# Patient Record
Sex: Male | Born: 1950 | Race: White | Hispanic: No | Smoking: Current every day smoker
Health system: Southern US, Community
[De-identification: ages and names within clinical notes are randomized; demographics above are authoritative.]

## PROBLEM LIST (undated history)

## (undated) DIAGNOSIS — B20 Human immunodeficiency virus [HIV] disease: Secondary | ICD-10-CM

## (undated) DIAGNOSIS — Z21 Asymptomatic human immunodeficiency virus [HIV] infection status: Secondary | ICD-10-CM

## (undated) DIAGNOSIS — I10 Essential (primary) hypertension: Secondary | ICD-10-CM

## (undated) HISTORY — PX: FEMORAL BYPASS: SHX50

---

## 2013-05-28 DIAGNOSIS — I739 Peripheral vascular disease, unspecified: Secondary | ICD-10-CM | POA: Insufficient documentation

## 2013-05-28 DIAGNOSIS — E785 Hyperlipidemia, unspecified: Secondary | ICD-10-CM | POA: Insufficient documentation

## 2013-05-28 DIAGNOSIS — I10 Essential (primary) hypertension: Secondary | ICD-10-CM | POA: Insufficient documentation

## 2013-05-28 DIAGNOSIS — Z21 Asymptomatic human immunodeficiency virus [HIV] infection status: Secondary | ICD-10-CM | POA: Insufficient documentation

## 2013-05-28 DIAGNOSIS — F17200 Nicotine dependence, unspecified, uncomplicated: Secondary | ICD-10-CM | POA: Insufficient documentation

## 2013-05-28 DIAGNOSIS — B20 Human immunodeficiency virus [HIV] disease: Secondary | ICD-10-CM | POA: Insufficient documentation

## 2014-01-07 ENCOUNTER — Encounter (HOSPITAL_BASED_OUTPATIENT_CLINIC_OR_DEPARTMENT_OTHER): Payer: Self-pay | Admitting: Emergency Medicine

## 2014-01-07 ENCOUNTER — Emergency Department (HOSPITAL_BASED_OUTPATIENT_CLINIC_OR_DEPARTMENT_OTHER): Payer: 59

## 2014-01-07 ENCOUNTER — Emergency Department (HOSPITAL_BASED_OUTPATIENT_CLINIC_OR_DEPARTMENT_OTHER)
Admission: EM | Admit: 2014-01-07 | Discharge: 2014-01-07 | Disposition: A | Discharge status: Home / Self Care | Payer: 59 | Attending: Emergency Medicine | Admitting: Emergency Medicine

## 2014-01-07 DIAGNOSIS — M5380 Other specified dorsopathies, site unspecified: Secondary | ICD-10-CM | POA: Insufficient documentation

## 2014-01-07 DIAGNOSIS — Z21 Asymptomatic human immunodeficiency virus [HIV] infection status: Secondary | ICD-10-CM | POA: Insufficient documentation

## 2014-01-07 DIAGNOSIS — Z79899 Other long term (current) drug therapy: Secondary | ICD-10-CM | POA: Insufficient documentation

## 2014-01-07 DIAGNOSIS — M62830 Muscle spasm of back: Secondary | ICD-10-CM

## 2014-01-07 DIAGNOSIS — I10 Essential (primary) hypertension: Secondary | ICD-10-CM | POA: Insufficient documentation

## 2014-01-07 HISTORY — DX: Asymptomatic human immunodeficiency virus (hiv) infection status: Z21

## 2014-01-07 HISTORY — DX: Human immunodeficiency virus (HIV) disease: B20

## 2014-01-07 HISTORY — DX: Essential (primary) hypertension: I10

## 2014-01-07 MED ORDER — KETOROLAC TROMETHAMINE 30 MG/ML IJ SOLN
30.0000 mg | Freq: Once | INTRAMUSCULAR | Status: AC
  Administered 2014-01-07: 30 mg via INTRAVENOUS
  Filled 2014-01-07: qty 1

## 2014-01-07 MED ORDER — METHOCARBAMOL 500 MG PO TABS
1000.0000 mg | ORAL_TABLET | Freq: Once | ORAL | Status: AC
  Administered 2014-01-07: 1000 mg via ORAL
  Filled 2014-01-07: qty 2

## 2014-01-07 MED ORDER — MELOXICAM 7.5 MG PO TABS
7.5000 mg | ORAL_TABLET | Freq: Every day | ORAL | Status: DC
Start: 2014-01-07 — End: 2015-02-09

## 2014-01-07 MED ORDER — METHOCARBAMOL 750 MG PO TABS: 750.0000 mg | ORAL_TABLET | Freq: Three times a day (TID) | ORAL | Status: DC

## 2014-01-07 MED ORDER — OXYCODONE-ACETAMINOPHEN 5-325 MG PO TABS: 1.0000 | ORAL_TABLET | Freq: Four times a day (QID) | ORAL | Status: DC | PRN

## 2014-01-07 NOTE — ED Provider Notes (Signed)
CSN: [id number]     Arrival date & time 01/07/14  0506 History   None    Chief Complaint  Patient presents with  . Back Pain     (Consider location/radiation/quality/duration/timing/severity/associated sxs/prior Treatment) Patient is a 63 y.o. male presenting with back pain. The history is provided by the patient. No language interpreter was used.  Back Pain Location:  Lumbar spine Quality:  Stabbing Radiates to:  L posterior upper leg and R posterior upper leg Pain severity:  Severe Pain is:  Same all the time Onset quality:  Sudden Timing:  Constant Progression:  Unchanged Chronicity:  Recurrent Context: not emotional stress, not falling, not MCA and not physical stress   Relieved by:  Nothing Worsened by:  Nothing tried Ineffective treatments:  None tried Associated symptoms: no abdominal pain, no abdominal swelling, no fever, no numbness, no paresthesias, no pelvic pain, no perianal numbness, no tingling, no weakness and no weight loss   Risk factors: no hx of cancer     Past Medical History  Diagnosis Date  . HIV (human immunodeficiency virus infection)   . Hypertension    No past surgical history on file. No family history on file. History  Substance Use Topics  . Smoking status: Not on file  . Smokeless tobacco: Not on file  . Alcohol Use: Not on file    Review of Systems  Constitutional: Negative for fever and weight loss.  Gastrointestinal: Negative for abdominal pain.  Genitourinary: Negative for pelvic pain.  Musculoskeletal: Positive for back pain.  Neurological: Negative for tingling, weakness, numbness and paresthesias.  All other systems reviewed and are negative.      Allergies  Review of patient's allergies indicates not on file.  Home Medications   Current Outpatient Rx  Name  Route  Sig  Dispense  Refill  . atenolol (TENORMIN) 25 MG tablet   Oral   Take by mouth daily.         . fenofibrate (TRIGLIDE) 50 MG tablet   Oral   Take  50 mg by mouth daily.          BP 130/63  Pulse 78  Temp(Src) 98.8 F (37.1 C) (Oral)  Resp 20  SpO2 95% Physical Exam  Constitutional: He is oriented to person, place, and time. He appears well-developed and well-nourished. No distress.  HENT:  Head: Normocephalic and atraumatic.  Mouth/Throat: Oropharynx is clear and moist.  Eyes: Conjunctivae are normal. Pupils are equal, round, and reactive to light.  Neck: Normal range of motion. Neck supple.  Cardiovascular: Normal rate and regular rhythm.   Pulmonary/Chest: Effort normal and breath sounds normal. He has no wheezes. He has no rales.  Abdominal: Soft. Bowel sounds are normal. There is no tenderness. There is no rebound and no guarding.  Musculoskeletal: Normal range of motion. He exhibits no edema and no tenderness.  Neurological: He is alert and oriented to person, place, and time. He has normal reflexes.  Skin: Skin is warm and dry.  Psychiatric: He has a normal mood and affect.    ED Course  Procedures (including critical care time) Labs Review Labs Reviewed - No data to display Imaging Review No results found.   EKG Interpretation None      MDM   Final diagnoses:  None   Lumbar spasm:   Improved post meds from EMS toradol and robaxin added, this is clearly muscle spasm will treat with robaxin, NSAIDs and a course of pain medication.  Follow up with  your PMD return for, weakness numbness changes in bowel or bladder incontinence    Geralynn Capri K Estera Ozier-Rasch, MD 01/07/14 [phone]

## 2014-01-07 NOTE — ED Notes (Signed)
Per EMS pt c/o non traumatic back pain since midnight, unable to move; given fentanyl IV with some relief, 20g to lt fa

## 2014-02-04 ENCOUNTER — Ambulatory Visit (INDEPENDENT_AMBULATORY_CARE_PROVIDER_SITE_OTHER): Payer: 59 | Admitting: Family Medicine

## 2014-02-04 ENCOUNTER — Encounter: Payer: Self-pay | Admitting: Family Medicine

## 2014-02-04 VITALS — BP 162/86 | HR 71 | Ht 72.0 in | Wt 230.0 lb

## 2014-02-04 DIAGNOSIS — M545 Low back pain, unspecified: Secondary | ICD-10-CM

## 2014-02-04 MED ORDER — PREDNISONE (PAK) 10 MG PO TABS: ORAL_TABLET | ORAL | Status: DC

## 2014-02-04 NOTE — Patient Instructions (Signed)
You have lumbar radiculopathy (a pinched nerve in your low back). Fill prednisone before you go on your trip in case this occurs while you're in [place]. Do home exercises as directed for next 6 weeks every day. Consider physical therapy if still having problems. Stay as active as possible. Physical therapy has been shown to be helpful as well. Strengthening of low back muscles, abdominal musculature are key for long term pain relief. If not improving, will consider further imaging (MRI). Follow up as needed.

## 2014-02-05 ENCOUNTER — Encounter: Payer: Self-pay | Admitting: Family Medicine

## 2014-02-05 DIAGNOSIS — M545 Low back pain, unspecified: Secondary | ICD-10-CM | POA: Insufficient documentation

## 2014-02-05 NOTE — Progress Notes (Signed)
Patient ID: Brandon Dalton, male   DOB: 02/16/1951, 63 y.o.   MRN: [id number]  PCP: Pcp Not In System  Subjective:   HPI: Patient is a 63 y.o. male here for low back pain.  Patient reports he has had intermittent low back pain flares about once a year for past several years. Has never had to see a physician for these until current flare that started 2-3 weeks ago. Recalls reaching into a car for a bag of grapes and twisted his back. Was really bad one morning when he couldn't get out of bed. Pain much better currently. Was given toradol in ED along with robaxin, mobic, percocet. Took these medicines for a week but not now. Some tingling into legs initially. No bowel/bladder dysfunction.  Past Medical History  Diagnosis Date  . HIV (human immunodeficiency virus infection)   . Hypertension     Current Outpatient Prescriptions on File Prior to Visit  Medication Sig Dispense Refill  . atenolol (TENORMIN) 25 MG tablet Take by mouth daily.      . fenofibrate (TRIGLIDE) 50 MG tablet Take 50 mg by mouth daily.      . meloxicam (MOBIC) 7.5 MG tablet Take 1 tablet (7.5 mg total) by mouth daily.  7 tablet  0  . methocarbamol (ROBAXIN) 750 MG tablet Take 1 tablet (750 mg total) by mouth 3 (three) times daily.  21 tablet  0  . oxyCODONE-acetaminophen (PERCOCET) 5-325 MG per tablet Take 1 tablet by mouth every 6 (six) hours as needed.  10 tablet  0   No current facility-administered medications on file prior to visit.    Past Surgical History  Procedure Laterality Date  . Femoral bypass      No Known Allergies  History   Social History  . Marital Status: Legally Separated    Spouse Name: N/A    Number of Children: N/A  . Years of Education: N/A   Occupational History  . Not on file.   Social History Main Topics  . Smoking status: Current Every Day Smoker -- 0.75 packs/day    Types: Cigarettes  . Smokeless tobacco: Not on file  . Alcohol Use: Not on file  . Drug Use: Not on  file  . Sexual Activity: Not on file   Other Topics Concern  . Not on file   Social History Narrative  . No narrative on file    History reviewed. No pertinent family history.  BP 162/86  Pulse 71  Ht 6' (1.829 m)  Wt 230 lb (104.327 kg)  BMI 31.19 kg/m2  Review of Systems: See HPI above.    Objective:  Physical Exam:   Gen: NAD  Back: No gross deformity, scoliosis. No TTP paraspinal muscles.  No midline or bony TTP. FROM without pain. Strength LEs 5/5 all muscle groups.   2+ MSRs in patellar and achilles tendons, equal bilaterally. Negative SLRs. Sensation intact to light touch bilaterally. Negative logroll bilateral hips Negative fabers and piriformis stretches.    Assessment & Plan:  1. Low back pain - history and symptoms of radiation into legs suggest disc bulge with lumbar radiculopathy though severe lumbar strain also possible.  Given home exercise program to do regularly.  Consider physical therapy.  Given script in hand for when he travels abroad so he has prednisone in case he has a severe episode when out of the country.  Consider MRI if not improving.  F/u prn.

## 2014-02-05 NOTE — Assessment & Plan Note (Signed)
history and symptoms of radiation into legs suggest disc bulge with lumbar radiculopathy though severe lumbar strain also possible.  Given home exercise program to do regularly.  Consider physical therapy.  Given script in hand for when he travels abroad so he has prednisone in case he has a severe episode when out of the country.  Consider MRI if not improving.  F/u prn.

## 2015-02-09 ENCOUNTER — Ambulatory Visit (INDEPENDENT_AMBULATORY_CARE_PROVIDER_SITE_OTHER): Payer: 59 | Admitting: Family Medicine

## 2015-02-09 ENCOUNTER — Encounter: Payer: Self-pay | Admitting: Family Medicine

## 2015-02-09 VITALS — BP 152/87 | HR 74 | Ht 72.0 in | Wt 220.0 lb

## 2015-02-09 DIAGNOSIS — M5441 Lumbago with sciatica, right side: Secondary | ICD-10-CM

## 2015-02-09 MED ORDER — PREDNISONE 10 MG PO TABS: ORAL_TABLET | ORAL | Status: DC

## 2015-02-09 MED ORDER — HYDROCODONE-ACETAMINOPHEN 5-325 MG PO TABS: 1.0000 | ORAL_TABLET | Freq: Four times a day (QID) | ORAL | Status: DC | PRN

## 2015-02-09 NOTE — Patient Instructions (Signed)
You have lumbar radiculopathy (a pinched nerve in your low back). A prednisone dose pack is the best option for immediate relief and may be prescribed. Day after finishing prednisone start aleve 2 tabs twice a day with food for pain and inflammation. Norco as needed for severe pain (no driving on this medicine). Stay as active as possible. Physical therapy has been shown to be helpful as well. Strengthening of low back muscles, abdominal musculature are key for long term pain relief. If not improving, will consider further imaging (MRI). Call me in 1-2 weeks to let me know how you're doing.  If you're improving we will add physical therapy.

## 2015-02-11 NOTE — Progress Notes (Signed)
Patient ID: Brandon Dalton, male   DOB: 02/26/1951, 64 y.o.   MRN: [id number]  PCP: Pcp Not In System  Subjective:   HPI: Patient is a 64 y.o. male here for low back pain.  02/04/14: Patient reports he has had intermittent low back pain flares about once a year for past several years. Has never had to see a physician for these until current flare that started 2-3 weeks ago. Recalls reaching into a car for a bag of grapes and twisted his back. Was really bad one morning when he couldn't get out of bed. Pain much better currently. Was given toradol in ED along with robaxin, mobic, percocet. Took these medicines for a week but not now. Some tingling into legs initially. No bowel/bladder dysfunction.  02/09/15: Patient returns with low back pain. States he improved after last visit then Friday afternoon pain started to come back. Pain was worse by Saturday. Feels like a bolt of lightning just to right of low back. Legs give out when this comes on - has happened twice. Pain up to 10/10 with certain movements, 0/10 at rest currently. Some tingling into his legs. He never had to take the prednisone after last visit here. No bowel/bladder dysfunction.  Past Medical History  Diagnosis Date  . HIV (human immunodeficiency virus infection)   . Hypertension     Current Outpatient Prescriptions on File Prior to Visit  Medication Sig Dispense Refill  . atenolol (TENORMIN) 25 MG tablet Take by mouth daily.    . fenofibrate (TRIGLIDE) 50 MG tablet Take 50 mg by mouth daily.     No current facility-administered medications on file prior to visit.    Past Surgical History  Procedure Laterality Date  . Femoral bypass      No Known Allergies  History   Social History  . Marital Status: Legally Separated    Spouse Name: N/A  . Number of Children: N/A  . Years of Education: N/A   Occupational History  . Not on file.   Social History Main Topics  . Smoking status: Current Every Day  Smoker -- 0.75 packs/day    Types: Cigarettes  . Smokeless tobacco: Not on file  . Alcohol Use: Not on file  . Drug Use: Not on file  . Sexual Activity: Not on file   Other Topics Concern  . Not on file   Social History Narrative    No family history on file.  BP 152/87 mmHg  Pulse 74  Ht 6' (1.829 m)  Wt 220 lb (99.791 kg)  BMI 29.83 kg/m2  Review of Systems: See HPI above.    Objective:  Physical Exam:   Gen: NAD  Back: No gross deformity, scoliosis. Mild TTP right paraspinal lumbar region.  No other tenderness. FROM with mild pain on flexion. Strength LEs 5/5 all muscle groups.   2+ MSRs in patellar and achilles tendons, equal bilaterally. Negative SLRs. Sensation intact to light touch bilaterally. Negative logroll bilateral hips Unable to comfortably lie down for fabers/piriformis stretches.    Assessment & Plan:  1. Low back pain - history and symptoms of radiation into legs suggest disc bulge with lumbar radiculopathy though severe lumbar strain also possible.  Start with prednisone dose pack, transition to aleve.  Norco as needed for severe pain.  Call us in 1-2 weeks - if improving will add physical therapy.

## 2015-02-11 NOTE — Assessment & Plan Note (Signed)
history and symptoms of radiation into legs suggest disc bulge with lumbar radiculopathy though severe lumbar strain also possible.  Start with prednisone dose pack, transition to aleve.  Norco as needed for severe pain.  Call us in 1-2 weeks - if improving will add physical therapy.

## 2015-02-22 ENCOUNTER — Encounter: Payer: Self-pay | Admitting: Podiatry

## 2015-02-22 ENCOUNTER — Ambulatory Visit (INDEPENDENT_AMBULATORY_CARE_PROVIDER_SITE_OTHER): Payer: 59 | Admitting: Podiatry

## 2015-02-22 VITALS — BP 149/83 | HR 87 | Ht 72.0 in | Wt 220.0 lb

## 2015-02-22 DIAGNOSIS — Q828 Other specified congenital malformations of skin: Secondary | ICD-10-CM | POA: Diagnosis not present

## 2015-02-22 DIAGNOSIS — M79672 Pain in left foot: Secondary | ICD-10-CM | POA: Diagnosis not present

## 2015-02-22 NOTE — Patient Instructions (Signed)
Seen for painful porokeratosis. Debrided keratoma and all nails. Return as needed.

## 2015-02-22 NOTE — Progress Notes (Signed)
Subjective: 64 year old male presents complaining of pain under the ball of left foot. He has had this problem over 6 months and has had it trimmed with good relief. Also having long toe nails due to recent back pain and unable to bend down to take care of own nails.   Objective: Neurovascular status are within normal. Porokeratotic lesion under 5th MPJ left foot, painful. No gross deformities noted on osseous structures. Hypertrophic nails x 10.  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  Plan: Reviewed findings and available treatment options. All lesions and nails debrided. Pain was relieved.

## 2015-05-24 ENCOUNTER — Encounter: Payer: Self-pay | Admitting: Family Medicine

## 2015-05-24 ENCOUNTER — Ambulatory Visit (INDEPENDENT_AMBULATORY_CARE_PROVIDER_SITE_OTHER): Payer: 59 | Admitting: Family Medicine

## 2015-05-24 ENCOUNTER — Encounter (INDEPENDENT_AMBULATORY_CARE_PROVIDER_SITE_OTHER): Payer: Self-pay

## 2015-05-24 VITALS — BP 138/81 | HR 69 | Ht 72.0 in | Wt 220.0 lb

## 2015-05-24 DIAGNOSIS — M545 Low back pain, unspecified: Secondary | ICD-10-CM

## 2015-05-24 NOTE — Patient Instructions (Signed)
Start physical therapy and do home exercises on days you don't go to therapy. These are the most important parts of your treatment to build your strength and confidence that you're not going to pull something in your back again. Heat as needed 15 minutes at a time. Remind me when you come back for follow up in just over a month about a prednisone script for when you're traveling.

## 2015-05-25 NOTE — Progress Notes (Signed)
Patient ID: Brandon Dalton, male   DOB: [birth year], 64 y.o.   MRN: [id number]  PCP: Pcp Not In System  Subjective:   HPI: Patient is a 64 y.o. male here for low back pain.  02/04/14: Patient reports he has had intermittent low back pain flares about once a year for past several years. Has never had to see a physician for these until current flare that started 2-3 weeks ago. Recalls reaching into a car for a bag of grapes and twisted his back. Was really bad one morning when he couldn't get out of bed. Pain much better currently. Was given toradol in ED along with robaxin, mobic, percocet. Took these medicines for a week but not now. Some tingling into legs initially. No bowel/bladder dysfunction.  02/09/15: Patient returns with low back pain. States he improved after last visit then Friday afternoon pain started to come back. Pain was worse by Saturday. Feels like a bolt of lightning just to right of low back. Legs give out when this comes on - has happened twice. Pain up to 10/10 with certain movements, 0/10 at rest currently. Some tingling into his legs. He never had to take the prednisone after last visit here. No bowel/bladder dysfunction.  8/8: Patient reports he has done well since last visit following prednisone and norco. No pain currently though is afraid to make certain moves for fear of pain returning. Is squatting instead of bending, moving more slowly. No radiation into legs. No numbness/tingling. No bowel/bladder dysfunction.  Past Medical History  Diagnosis Date  . HIV (human immunodeficiency virus infection)   . Hypertension     Current Outpatient Prescriptions on File Prior to Visit  Medication Sig Dispense Refill  . atenolol (TENORMIN) 25 MG tablet Take by mouth daily.    . fenofibrate (TRIGLIDE) 50 MG tablet Take 50 mg by mouth daily.    Marland Kitchen HYDROcodone-acetaminophen (NORCO) 5-325 MG per tablet Take 1 tablet by mouth every 6 (six) hours as needed for  moderate pain. (Patient not taking: Reported on 02/22/2015) 40 tablet 0   No current facility-administered medications on file prior to visit.    Past Surgical History  Procedure Laterality Date  . Femoral bypass      No Known Allergies  History   Social History  . Marital Status: Legally Separated    Spouse Name: N/A  . Number of Children: N/A  . Years of Education: N/A   Occupational History  . Not on file.   Social History Main Topics  . Smoking status: Current Every Day Smoker -- 0.75 packs/day    Types: Cigarettes  . Smokeless tobacco: Never Used  . Alcohol Use: Not on file  . Drug Use: Not on file  . Sexual Activity: Not on file   Other Topics Concern  . Not on file   Social History Narrative    No family history on file.  BP 138/81 mmHg  Pulse 69  Ht 6' (1.829 m)  Wt 220 lb (99.791 kg)  BMI 29.83 kg/m2  Review of Systems: See HPI above.    Objective:  Physical Exam:   Gen: NAD  Back: No gross deformity, scoliosis. No TTP right paraspinal lumbar region.  No other tenderness. FROM without pain. Strength LEs 5/5 all muscle groups.   2+ MSRs in patellar and achilles tendons, equal bilaterally. Negative SLRs. Sensation intact to light touch bilaterally. Negative logroll bilateral hips Negative fabers, piriformis stretches.    Assessment & Plan:  1. Low back pain -  history and symptoms of radiation into legs suggest disc bulge with lumbar radiculopathy.  Clinically improved at this point though still hesitant to do certain motions.  Advised we go ahead with physical therapy and home exercise program.  Heat if needed.  F/u in 1 month.

## 2015-05-25 NOTE — Assessment & Plan Note (Signed)
history and symptoms of radiation into legs suggest disc bulge with lumbar radiculopathy.  Clinically improved at this point though still hesitant to do certain motions.  Advised we go ahead with physical therapy and home exercise program.  Heat if needed.  F/u in 1 month.

## 2015-06-23 ENCOUNTER — Ambulatory Visit (INDEPENDENT_AMBULATORY_CARE_PROVIDER_SITE_OTHER): Payer: 59 | Admitting: Podiatry

## 2015-06-23 ENCOUNTER — Encounter: Payer: Self-pay | Admitting: Podiatry

## 2015-06-23 VITALS — BP 157/85 | HR 70

## 2015-06-23 DIAGNOSIS — Q828 Other specified congenital malformations of skin: Secondary | ICD-10-CM

## 2015-06-23 DIAGNOSIS — M79672 Pain in left foot: Secondary | ICD-10-CM

## 2015-06-23 NOTE — Patient Instructions (Signed)
Seen for painful lesion. Debrided. Return as needed.

## 2015-06-23 NOTE — Progress Notes (Signed)
Subjective: 64 year old male presents complaining of pain under the ball of left foot.  Patient request the lesion be trimmed. Last visit gave him a good relief.   Objective: Neurovascular status are within normal. Porokeratotic lesion under 5th MPJ left foot, painful. No gross deformities noted on osseous structures. Hypertrophic nails x 10.  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  Plan: Reviewed findings and available treatment options. Painful lesion debrided. Discussed possible office procedure to remove the lesion. 

## 2015-06-28 ENCOUNTER — Ambulatory Visit (INDEPENDENT_AMBULATORY_CARE_PROVIDER_SITE_OTHER): Payer: 59 | Admitting: Family Medicine

## 2015-06-28 ENCOUNTER — Encounter: Payer: Self-pay | Admitting: Family Medicine

## 2015-06-28 VITALS — BP 138/79 | HR 73 | Ht 72.0 in | Wt 210.0 lb

## 2015-06-28 DIAGNOSIS — M545 Low back pain, unspecified: Secondary | ICD-10-CM

## 2015-06-28 MED ORDER — PREDNISONE 10 MG PO TABS: ORAL_TABLET | ORAL | Status: DC

## 2015-06-28 MED ORDER — HYDROCODONE-ACETAMINOPHEN 5-325 MG PO TABS: 1.0000 | ORAL_TABLET | Freq: Four times a day (QID) | ORAL | Status: DC | PRN

## 2015-06-28 NOTE — Patient Instructions (Signed)
Do home exercises and stretches most days of the week. Follow up with me in 6 weeks only if needed (if we need to talk about an MRI which I doubt would be the case). Have fun in [place] otherwise!

## 2015-06-30 NOTE — Assessment & Plan Note (Signed)
history and symptoms of radiation into legs suggest disc bulge with lumbar radiculopathy.  Clinically improved.  Doing well with physical therapy and home exercises.  Continue HEP.  Refilled scripts.  F/u prn.

## 2015-06-30 NOTE — Progress Notes (Signed)
Patient ID: Brandon Dalton, male   DOB: April 19, 1951, 64 y.o.   MRN: [id number]  PCP: Pcp Not In System  Subjective:   HPI: Patient is a 64 y.o. male here for low back pain.  02/04/14: Patient reports he has had intermittent low back pain flares about once a year for past several years. Has never had to see a physician for these until current flare that started 2-3 weeks ago. Recalls reaching into a car for a bag of grapes and twisted his back. Was really bad one morning when he couldn't get out of bed. Pain much better currently. Was given toradol in ED along with robaxin, mobic, percocet. Took these medicines for a week but not now. Some tingling into legs initially. No bowel/bladder dysfunction.  02/09/15: Patient returns with low back pain. States he improved after last visit then Friday afternoon pain started to come back. Pain was worse by Saturday. Feels like a bolt of lightning just to right of low back. Legs give out when this comes on - has happened twice. Pain up to 10/10 with certain movements, 0/10 at rest currently. Some tingling into his legs. He never had to take the prednisone after last visit here. No bowel/bladder dysfunction.  8/8: Patient reports he has done well since last visit following prednisone and norco. No pain currently though is afraid to make certain moves for fear of pain returning. Is squatting instead of bending, moving more slowly. No radiation into legs. No numbness/tingling. No bowel/bladder dysfunction.  9/12 Patient reports he feels better. Still very tentative about motion and being cautious. No radiation. No numbness/tingling. No bowel/bladder dysfunction. Did physical therapy for about 6 visits to date with benefit.  Past Medical History  Diagnosis Date  . HIV (human immunodeficiency virus infection)   . Hypertension     Current Outpatient Prescriptions on File Prior to Visit  Medication Sig Dispense Refill  . atenolol  (TENORMIN) 25 MG tablet Take by mouth daily.    . fenofibrate (TRIGLIDE) 50 MG tablet Take 50 mg by mouth daily.     No current facility-administered medications on file prior to visit.    Past Surgical History  Procedure Laterality Date  . Femoral bypass      No Known Allergies  Social History   Social History  . Marital Status: Legally Separated    Spouse Name: N/A  . Number of Children: N/A  . Years of Education: N/A   Occupational History  . Not on file.   Social History Main Topics  . Smoking status: Current Every Day Smoker -- 0.75 packs/day    Types: Cigarettes  . Smokeless tobacco: Never Used  . Alcohol Use: Not on file  . Drug Use: Not on file  . Sexual Activity: Not on file   Other Topics Concern  . Not on file   Social History Narrative    No family history on file.  BP 138/79 mmHg  Pulse 73  Ht 6' (1.829 m)  Wt 210 lb (95.255 kg)  BMI 28.47 kg/m2  Review of Systems: See HPI above.    Objective:  Physical Exam:   Gen: NAD  Back: No gross deformity, scoliosis. No TTP right paraspinal lumbar region.  No other tenderness. FROM without pain. Strength LEs 5/5 all muscle groups.   2+ MSRs in patellar and achilles tendons, equal bilaterally. Negative SLRs. Sensation intact to light touch bilaterally. Negative logroll bilateral hips Negative fabers, piriformis stretches.    Assessment & Plan:  1.  Low back pain - history and symptoms of radiation into legs suggest disc bulge with lumbar radiculopathy.  Clinically improved.  Doing well with physical therapy and home exercises.  Continue HEP.  Refilled scripts.  F/u prn.

## 2015-11-18 ENCOUNTER — Ambulatory Visit (INDEPENDENT_AMBULATORY_CARE_PROVIDER_SITE_OTHER): Payer: 59 | Admitting: Podiatry

## 2015-11-18 ENCOUNTER — Encounter: Payer: Self-pay | Admitting: Podiatry

## 2015-11-18 VITALS — BP 168/87 | HR 77

## 2015-11-18 DIAGNOSIS — Q828 Other specified congenital malformations of skin: Secondary | ICD-10-CM

## 2015-11-18 DIAGNOSIS — M79672 Pain in left foot: Secondary | ICD-10-CM

## 2015-11-18 NOTE — Patient Instructions (Signed)
Seen for painful callus. Debrided the lesion. Return as needed.

## 2015-11-18 NOTE — Progress Notes (Signed)
Subjective: 65 year old male presents complaining of pain under the ball of left foot.  Patient request the lesion be trimmed. Last visit gave him a good relief.   Objective: Neurovascular status are within normal. Porokeratotic lesion under 5th MPJ left foot, painful. No gross deformities noted on osseous structures. Hypertrophic nails x 10.  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  Plan: Reviewed findings and available treatment options. Painful lesion debrided. Discussed possible office procedure to remove the lesion.

## 2015-11-22 ENCOUNTER — Ambulatory Visit: Payer: 59 | Admitting: Podiatry

## 2016-05-03 ENCOUNTER — Encounter: Payer: Self-pay | Admitting: Family Medicine

## 2016-05-03 ENCOUNTER — Ambulatory Visit (INDEPENDENT_AMBULATORY_CARE_PROVIDER_SITE_OTHER): Payer: 59 | Admitting: Family Medicine

## 2016-05-03 VITALS — BP 148/78 | HR 73 | Ht 72.0 in | Wt 230.0 lb

## 2016-05-03 DIAGNOSIS — M7021 Olecranon bursitis, right elbow: Secondary | ICD-10-CM

## 2016-05-03 MED ORDER — DICLOFENAC SODIUM 75 MG PO TBEC: 75.0000 mg | DELAYED_RELEASE_TABLET | Freq: Two times a day (BID) | ORAL | Status: AC

## 2016-05-03 MED ORDER — PREDNISONE 10 MG PO TABS: ORAL_TABLET | ORAL | Status: AC

## 2016-05-03 MED ORDER — HYDROCODONE-ACETAMINOPHEN 5-325 MG PO TABS: 1.0000 | ORAL_TABLET | Freq: Four times a day (QID) | ORAL | Status: DC | PRN

## 2016-05-03 NOTE — Patient Instructions (Addendum)
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 Ice the area 3-4 times a day for 15 minutes at a time Diclofenac 75mg  twice a day with food for 7-10 days then as needed for the inflammation and swelling. Compression wrap or sleeve regularly keep swelling down. Consider aspiration and injection of the area as well if this is bothersome to you. Follow up with me as needed otherwise.

## 2016-05-04 DIAGNOSIS — M7021 Olecranon bursitis, right elbow: Secondary | ICD-10-CM | POA: Insufficient documentation

## 2016-05-04 NOTE — Progress Notes (Signed)
PCP: Pcp Not In System  Subjective:   HPI: Patient is a 65 y.o. male here for right elbow swelling.  Patient reports for about 2 weeks he's had swelling over right elbow. No known injury or trauma. No pain associated with this. Does feel unusual when leaning on the elbow. Is right handed. Has not tried anything for this. No skin changes, numbness.  Past Medical History  Diagnosis Date  . HIV (human immunodeficiency virus infection) (HCC)   . Hypertension     Current Outpatient Prescriptions on File Prior to Visit  Medication Sig Dispense Refill  . amLODipine (NORVASC) 10 MG tablet Take 10 mg by mouth.    Marland Kitchen. atenolol-chlorthalidone (TENORETIC) 100-25 MG tablet Take by mouth.    Marland Kitchen. atorvastatin (LIPITOR) 40 MG tablet Take 40 mg by mouth.    . dolutegravir (TIVICAY) 50 MG tablet Take 50 mg by mouth.    Marland Kitchen. emtricitabine-tenofovir AF (DESCOVY) 200-25 MG tablet Take by mouth.    . fenofibrate 54 MG tablet Take 54 mg by mouth.    . potassium chloride (K-DUR,KLOR-CON) 10 MEQ tablet Take 10 mEq by mouth.     No current facility-administered medications on file prior to visit.    Past Surgical History  Procedure Laterality Date  . Femoral bypass      No Known Allergies  Social History   Social History  . Marital Status: Legally Separated    Spouse Name: N/A  . Number of Children: N/A  . Years of Education: N/A   Occupational History  . Not on file.   Social History Main Topics  . Smoking status: Current Every Day Smoker -- 0.50 packs/day    Types: Cigarettes  . Smokeless tobacco: Never Used  . Alcohol Use: Not on file  . Drug Use: Not on file  . Sexual Activity: Not on file   Other Topics Concern  . Not on file   Social History Narrative    No family history on file.  BP 148/78 mmHg  Pulse 73  Ht 6' (1.829 m)  Wt 230 lb (104.327 kg)  BMI 31.19 kg/m2  Review of Systems: See HPI above.    Objective:  Physical Exam:  Gen: NAD, comfortable in exam  room  Right elbow: Swelling olecranon bursa.  No other swelling, bruising, deformity. No TTP throughout elbow. FROM elbow and wrist without pain.  5/5 strength. Negative tinels cubital tunnel. Collateral ligaments intact. NVI distally.  Left elbow: FROM without pain.    Assessment & Plan:  1. Right elbow olecranon bursitis - he is asymptomatic.  I advised him as this is not infected or causing pain we should do conservative measures - compression, nsaids for 7-10 days, icing.  Try to avoid leaning on this.  If bothering him and doesn't improve advised we could go ahead with aspiration and injection.  F/u prn.

## 2016-05-04 NOTE — Assessment & Plan Note (Signed)
he is asymptomatic.  I advised him as this is not infected or causing pain we should do conservative measures - compression, nsaids for 7-10 days, icing.  Try to avoid leaning on this.  If bothering him and doesn't improve advised we could go ahead with aspiration and injection.  F/u prn.

## 2017-01-10 ENCOUNTER — Ambulatory Visit: Payer: 59 | Admitting: Podiatry

## 2017-01-10 ENCOUNTER — Encounter: Payer: Self-pay | Admitting: Podiatry

## 2017-01-10 ENCOUNTER — Ambulatory Visit (INDEPENDENT_AMBULATORY_CARE_PROVIDER_SITE_OTHER): Payer: 59 | Admitting: Podiatry

## 2017-01-10 DIAGNOSIS — Q828 Other specified congenital malformations of skin: Secondary | ICD-10-CM

## 2017-01-10 DIAGNOSIS — M79672 Pain in left foot: Secondary | ICD-10-CM

## 2017-01-10 NOTE — Patient Instructions (Signed)
Seen for painful lesion under left foot. Lesion debrided. Return as needed.

## 2017-01-10 NOTE — Progress Notes (Signed)
Subjective: 66 year old male presents complaining of pain under the ball of left foot due to a recurring lesion. Patient request the lesion be trimmed.  Last visit gave him a good relief.   Objective: Neurovascular status are within normal. Porokeratotic lesion under 5th MPJ left foot, painful. No gross deformities noted on osseous structures. Hypertrophic nails x 10.  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  Plan: Reviewed findings and available treatment options. Painful lesion debrided. Discussed possible office procedure to remove the lesion.

## 2017-02-27 ENCOUNTER — Ambulatory Visit (INDEPENDENT_AMBULATORY_CARE_PROVIDER_SITE_OTHER): Payer: 59 | Admitting: Family Medicine

## 2017-02-27 ENCOUNTER — Encounter: Payer: Self-pay | Admitting: Family Medicine

## 2017-02-27 VITALS — BP 128/80 | HR 66 | Ht 72.0 in | Wt 220.0 lb

## 2017-02-27 DIAGNOSIS — G8929 Other chronic pain: Secondary | ICD-10-CM | POA: Diagnosis not present

## 2017-02-27 DIAGNOSIS — M545 Low back pain: Secondary | ICD-10-CM | POA: Diagnosis not present

## 2017-02-27 MED ORDER — DIAZEPAM 5 MG PO TABS: ORAL_TABLET | ORAL | 0 refills | Status: AC

## 2017-02-27 MED ORDER — HYDROCODONE-ACETAMINOPHEN 7.5-325 MG PO TABS: 1.0000 | ORAL_TABLET | Freq: Four times a day (QID) | ORAL | 0 refills | Status: AC | PRN

## 2017-02-27 NOTE — Patient Instructions (Signed)
We will go ahead with an MRI of your lumbar spine. I will call you with the results and next steps after this. Take valium for claustrophobia after this. Use the norco only if needed for severe pain.

## 2017-02-28 NOTE — Progress Notes (Addendum)
Patient ID: Brandon Dalton, male   DOB: [birth year], 66 y.o.   MRN: [id number]  PCP: System, Pcp Not In  Subjective:   HPI: Patient is a 66 y.o. male here for low back pain.  02/04/14: Patient reports he has had intermittent low back pain flares about once a year for past several years. Has never had to see a physician for these until current flare that started 2-3 weeks ago. Recalls reaching into a car for a bag of grapes and twisted his back. Was really bad one morning when he couldn't get out of bed. Pain much better currently. Was given toradol in ED along with robaxin, mobic, percocet. Took these medicines for a week but not now. Some tingling into legs initially. No bowel/bladder dysfunction.  02/09/15: Patient returns with low back pain. States he improved after last visit then Friday afternoon pain started to come back. Pain was worse by Saturday. Feels like a bolt of lightning just to right of low back. Legs give out when this comes on - has happened twice. Pain up to 10/10 with certain movements, 0/10 at rest currently. Some tingling into his legs. He never had to take the prednisone after last visit here. No bowel/bladder dysfunction.  8/8: Patient reports he has done well since last visit following prednisone and norco. No pain currently though is afraid to make certain moves for fear of pain returning. Is squatting instead of bending, moving more slowly. No radiation into legs. No numbness/tingling. No bowel/bladder dysfunction.  06/28/15: Patient reports he feels better. Still very tentative about motion and being cautious. No radiation. No numbness/tingling. No bowel/bladder dysfunction. Did physical therapy for about 6 visits to date with benefit.  02/27/17: Patient returns noting about 10 days ago while on a trip in the [place] he twisted his back. Felt sharp pain in low back across whole back and legs felt weak. Didn't feel like pain went into his  legs though. Then yesterday was getting out of the car and felt similar. No bowel/bladder dysfunction. No skin changes, numbness. Pain level now is down to 0/10 but was very sharp, severe, had difficulty moving after the issues above.  Past Medical History:  Diagnosis Date  . HIV (human immunodeficiency virus infection) (HCC)   . Hypertension     Current Outpatient Prescriptions on File Prior to Visit  Medication Sig Dispense Refill  . amLODipine (NORVASC) 10 MG tablet Take 10 mg by mouth.    Marland Kitchen atenolol-chlorthalidone (TENORETIC) 100-25 MG tablet Take by mouth.    Marland Kitchen atorvastatin (LIPITOR) 40 MG tablet Take 40 mg by mouth.    . diclofenac (VOLTAREN) 75 MG EC tablet Take 1 tablet (75 mg total) by mouth 2 (two) times daily. 60 tablet 1  . emtricitabine-tenofovir AF (DESCOVY) 200-25 MG tablet Take by mouth.    . fenofibrate 54 MG tablet Take 54 mg by mouth.    . potassium chloride (K-DUR,KLOR-CON) 10 MEQ tablet Take 10 mEq by mouth.    . predniSONE (DELTASONE) 10 MG tablet 6 tabs po day 1, 5 tabs po day 2, 4 tabs po day 3, 3 tabs po day 4, 2 tabs po day 5, 1 tab po day 6 21 tablet 0   No current facility-administered medications on file prior to visit.     Past Surgical History:  Procedure Laterality Date  . FEMORAL BYPASS      No Known Allergies  Social History   Social History  . Marital status: Legally Separated  Spouse name: N/A  . Number of children: N/A  . Years of education: N/A   Occupational History  . Not on file.   Social History Main Topics  . Smoking status: Current Every Day Smoker    Packs/day: 0.50    Types: Cigarettes  . Smokeless tobacco: Never Used  . Alcohol use Not on file  . Drug use: Unknown  . Sexual activity: Not on file   Other Topics Concern  . Not on file   Social History Narrative  . No narrative on file    No family history on file.  BP 128/80   Pulse 66   Ht 6' (1.829 m)   Wt 220 lb (99.8 kg)   BMI 29.84 kg/m   Review  of Systems: See HPI above.    Objective:  Physical Exam:   Gen: NAD  Back: No gross deformity, scoliosis. No TTP right paraspinal lumbar region.  No other tenderness. Pain when attempting extension.  Full flexion without pain. Strength LEs 5/5 all muscle groups.   2+ MSRs in patellar and achilles tendons, equal bilaterally. Negative SLRs. Sensation intact to light touch bilaterally. Negative logroll bilateral hips Negative fabers, piriformis stretches.    Assessment & Plan:  1. Low back pain - history and symptoms of radiation into legs suggest disc bulge with lumbar radiculopathy.  Clinically improved now.  Encouraged home exercises.  Prednisone, norco if needed.  Given multiple recurrences despite conservative treatment will go ahead with MRI to assess for disc herniation.  Addendum:  MRI reviewed and discussed with patient.  He has degenerative changes as expected.  No evidence encroachment on the spinal cord.  He does have some degenerative disc disease with bulges - some narrowing as a result of right L3 nerve root.  Possible he exacerbated here when in [place] and has shrunk down since, not causing active impingement.  Recommended considering repeat physical therapy.  ESI an option if flares up again.  He is moving to [place] at the end of the month and will consider starting therapy when moving there - he will call us with any change in status, will pick up copies of notes, imaging reports.

## 2017-02-28 NOTE — Assessment & Plan Note (Signed)
history and symptoms of radiation into legs suggest disc bulge with lumbar radiculopathy.  Clinically improved now.  Encouraged home exercises.  Prednisone, norco if needed.  Given multiple recurrences despite conservative treatment will go ahead with MRI to assess for disc herniation.

## 2017-03-02 ENCOUNTER — Ambulatory Visit (HOSPITAL_BASED_OUTPATIENT_CLINIC_OR_DEPARTMENT_OTHER)
Admission: RE | Admit: 2017-03-02 | Discharge: 2017-03-02 | Disposition: A | Discharge status: Home / Self Care | Payer: 59 | Source: Ambulatory Visit | Attending: Family Medicine | Admitting: Family Medicine

## 2017-03-02 DIAGNOSIS — M545 Low back pain, unspecified: Secondary | ICD-10-CM

## 2017-03-02 DIAGNOSIS — M5136 Other intervertebral disc degeneration, lumbar region: Secondary | ICD-10-CM | POA: Diagnosis not present

## 2017-03-02 DIAGNOSIS — G8929 Other chronic pain: Secondary | ICD-10-CM | POA: Insufficient documentation

## 2017-03-05 NOTE — Addendum Note (Signed)
Addended by: Kathi SimpersWISE, Tamberly Pomplun F on: 03/05/2017 08:20 AM   Modules accepted: Orders

## 2017-03-07 NOTE — Addendum Note (Signed)
Addended by: Kathi SimpersWISE, Kaidyn Hernandes F on: 03/07/2017 12:40 PM   Modules accepted: Orders

## 2017-03-10 ENCOUNTER — Ambulatory Visit (HOSPITAL_BASED_OUTPATIENT_CLINIC_OR_DEPARTMENT_OTHER): Payer: 59

## 2017-03-13 ENCOUNTER — Ambulatory Visit (INDEPENDENT_AMBULATORY_CARE_PROVIDER_SITE_OTHER): Payer: 59

## 2017-03-13 DIAGNOSIS — M48061 Spinal stenosis, lumbar region without neurogenic claudication: Secondary | ICD-10-CM

## 2017-03-13 DIAGNOSIS — G8929 Other chronic pain: Secondary | ICD-10-CM

## 2017-03-13 DIAGNOSIS — M545 Low back pain: Principal | ICD-10-CM

## 2018-08-04 IMAGING — MR MR LUMBAR SPINE W/O CM
4 of 5 series · 25 of 48 positions shown · non-contrast
Comparison: Lumbar spine radiographs 03/02/2017.

CLINICAL DATA: Low back pain with bilateral leg pain and weakness.
Chronic symptoms. No acute injury or prior relevant surgery.

EXAM:
MRI LUMBAR SPINE WITHOUT CONTRAST
TECHNIQUE: Multiplanar, multisequence MR imaging of the lumbar spine was
performed. No intravenous contrast was administered.

[Series 2: T2 · sagittal · 4.0mm · 0.81mm/px · 7 of 17 slices shown (1 of 2)]
[im 1/17]
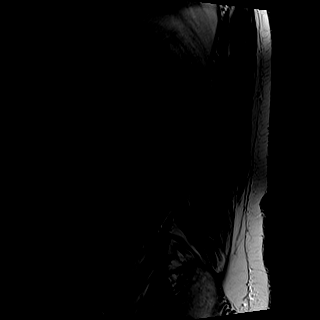
[im 3/17]
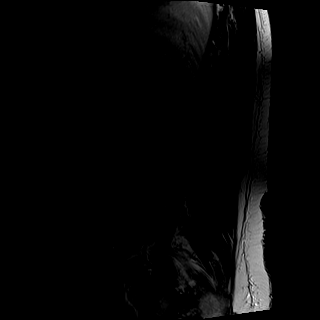
[im 6/17]
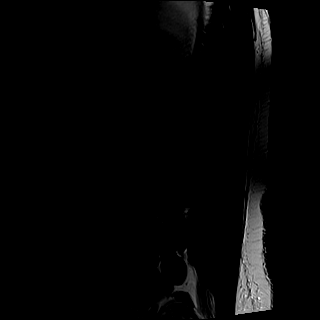
[im 9/17]
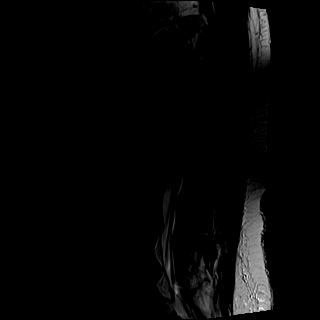
[im 11/17]
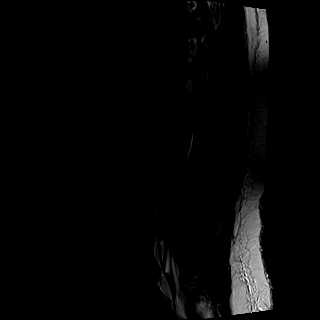
[im 14/17]
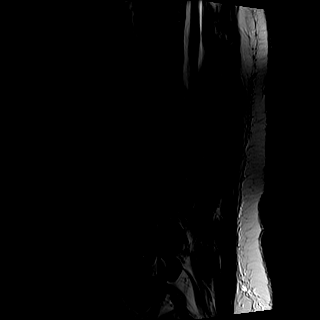
[im 17/17]
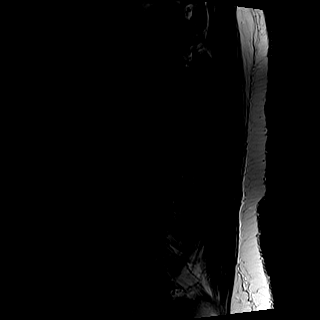

[Series 3: T1 · sagittal · 4.0mm · 0.41mm/px · 7 of 17 slices shown (1 of 2)]
[im 1/17]
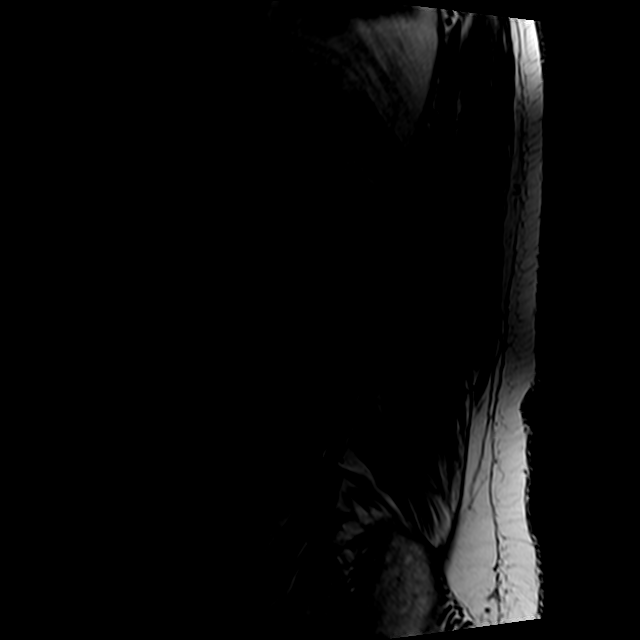
[im 3/17]
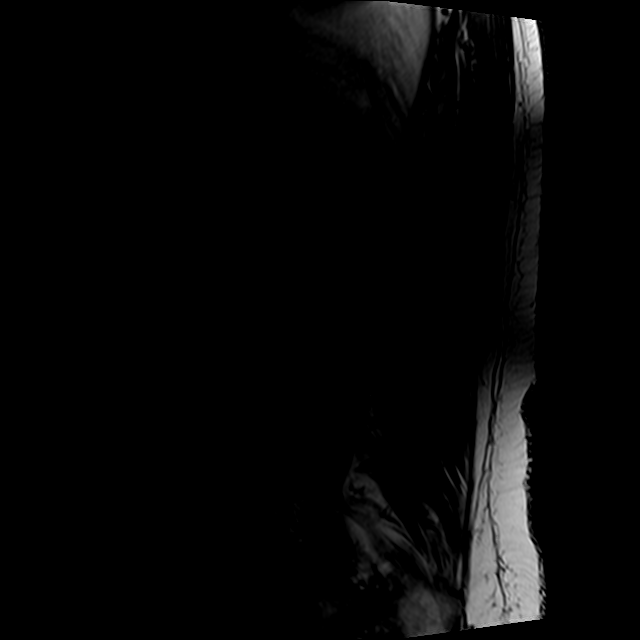
[im 6/17]
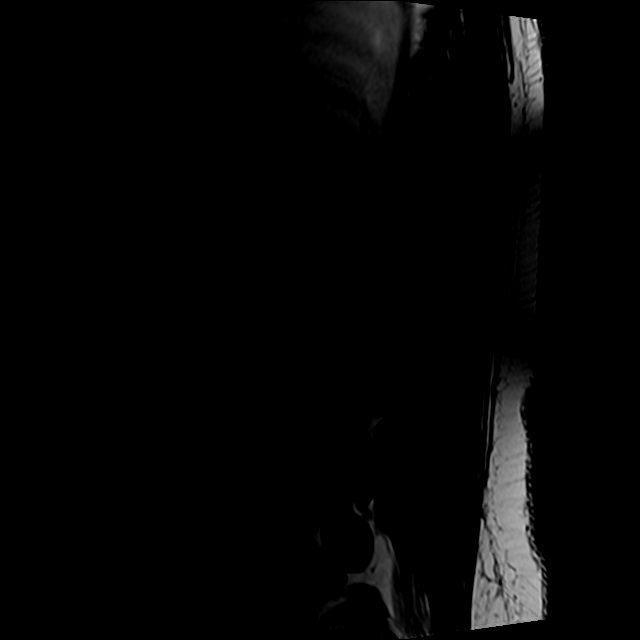
[im 9/17]
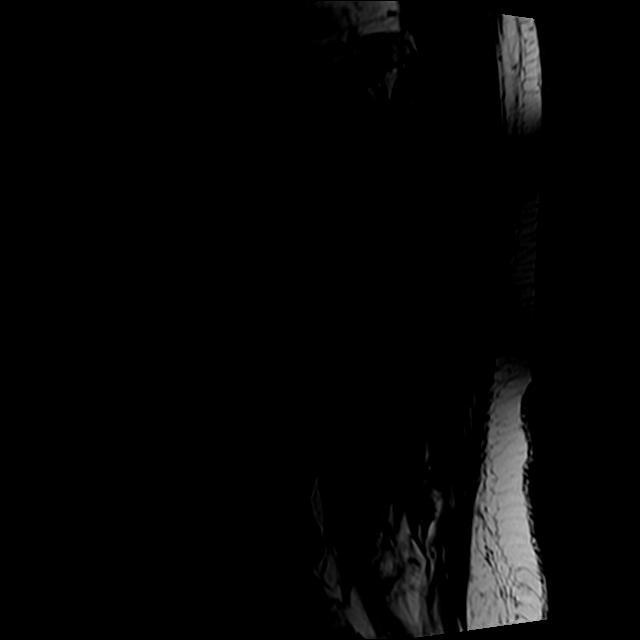
[im 11/17]
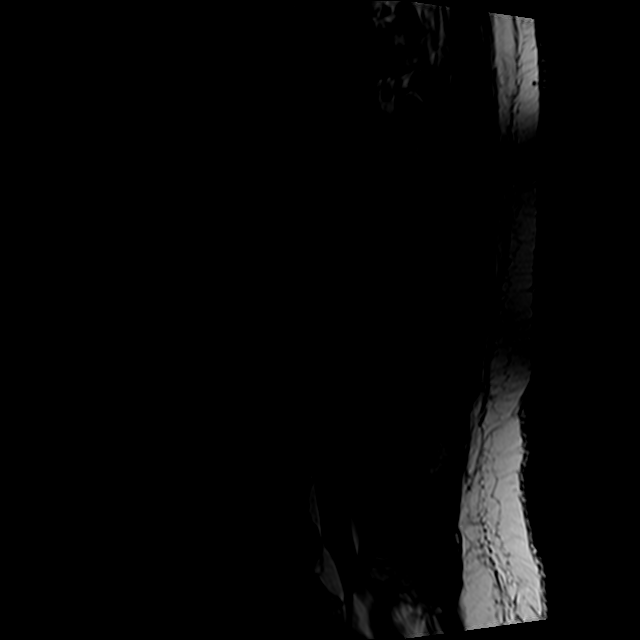
[im 14/17]
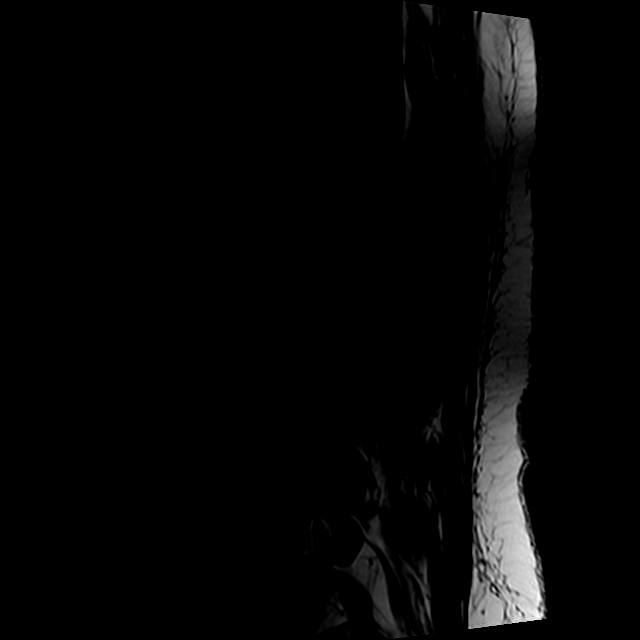
[im 17/17]
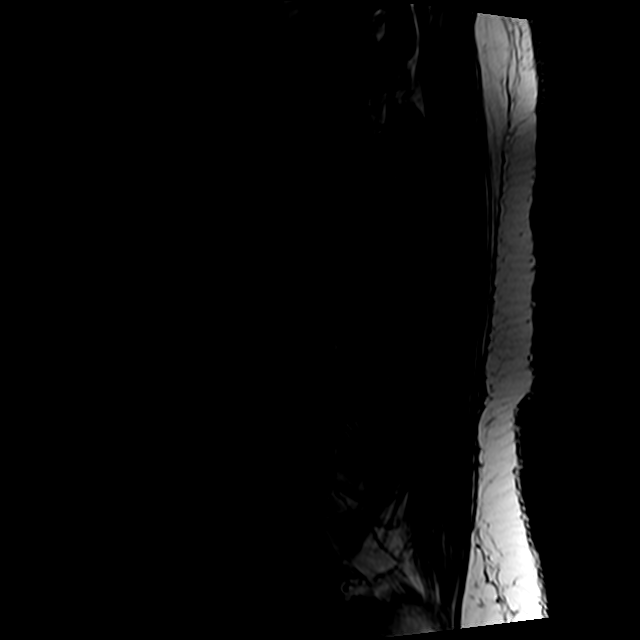

[Series 5: T2 · axial · 4.0mm · 0.78mm/px · z∈[-35,+165]mm · 8 of 38 slices shown (2 of 2)]
[im 1/38]
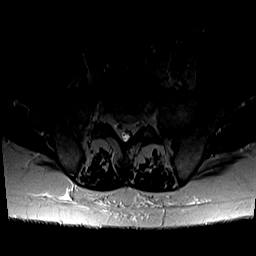
[im 6/38]
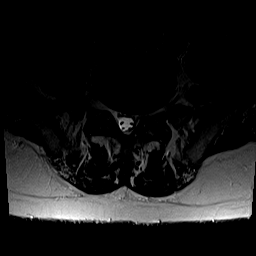
[im 12/38]
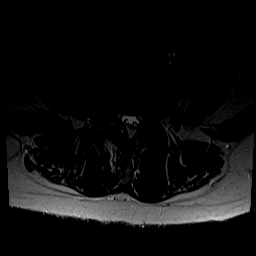
[im 18/38]
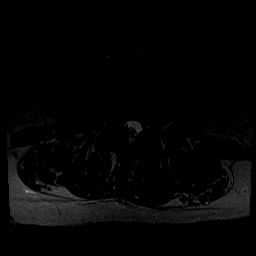
[im 20/38]
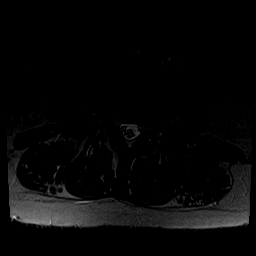
[im 26/38]
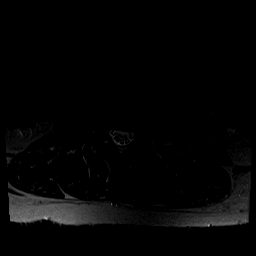
[im 32/38]
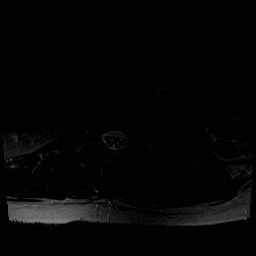
[im 38/38]
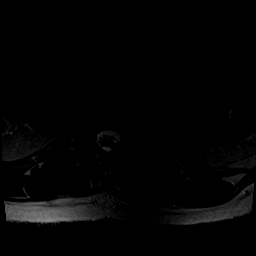

[Series 6: T1 · axial · 4.0mm · 0.39mm/px · z∈[-11,+136]mm · 3 of 38 slices shown (2 of 2)]
[im 6/38]
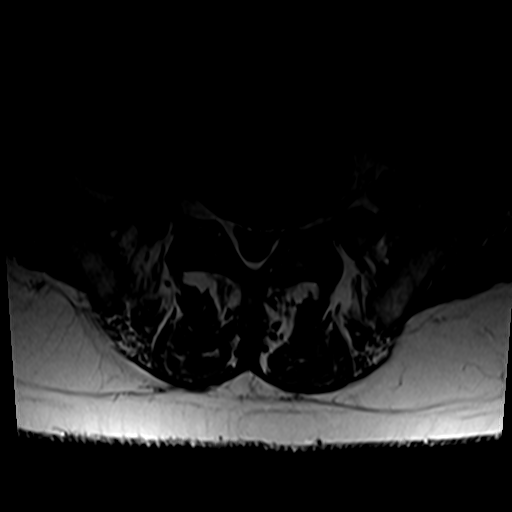
[im 20/38]
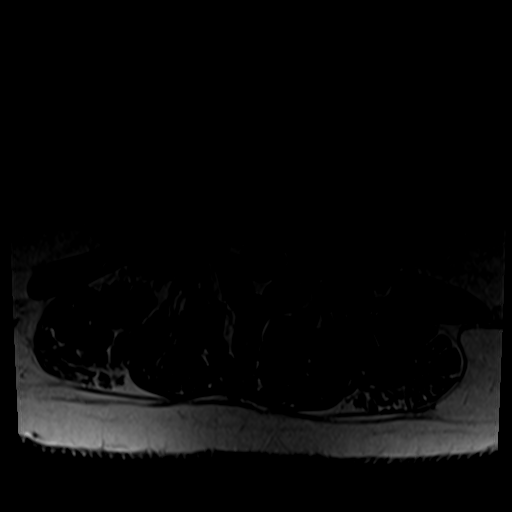
[im 32/38]
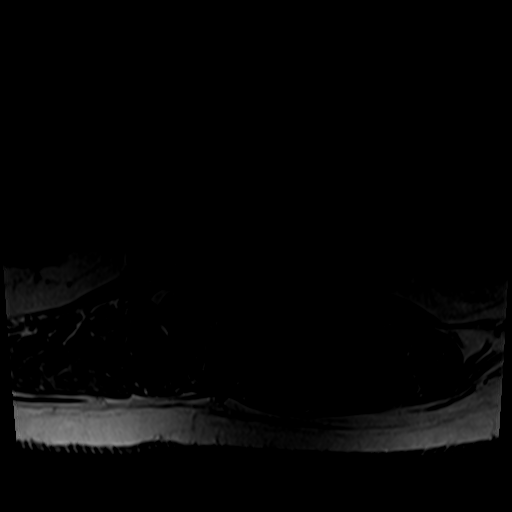

[25 of 48 positions shown; findings below may reference images not displayed]

FINDINGS: Segmentation: Conventional anatomy assumed, with the last open disc
space designated L5-S1.

Alignment: Stable convex left scoliosis. The lateral alignment is
normal.

Vertebrae: No worrisome osseous lesion, acute fracture or pars
defect. The lumbar pedicles are somewhat short on a congenital
basis. The visualized sacroiliac joints appear unremarkable.

Conus medullaris: Extends to the L1-2 level and appears normal.

Paraspinal and other soft tissues: No significant paraspinal
findings.

Disc levels:

L1-2: Mild disc bulging. No spinal stenosis or nerve root
encroachment.

L2-3: Mild disc bulging and small central disc protrusion. No spinal
stenosis or nerve root encroachment.

L3-4: Annular disc bulging and endplate osteophytes asymmetric to
the right. Mild facet and ligamentous hypertrophy. There is mild
narrowing of the right lateral recess and right foramen without
possible extraforaminal right L3 nerve root encroachment.

L4-5: Annular disc bulging eccentric laterally to the right and
broad-based shallow central disc protrusion. Mild facet and
ligamentous hypertrophy. There is resulting mild spinal stenosis
with mild narrowing of the lateral recesses and foramina
bilaterally.

L5-S1: Mild disc bulging, facet and ligamentous hypertrophy. No
significant spinal stenosis or nerve root encroachment.
IMPRESSION: 1. No suggested acute findings.
2. Multilevel spondylosis superimposed on a congenitally small
spinal canal. No high-grade spinal stenosis.
3. Mild narrowing of the right lateral recess and right foramen at
L3-4 with possible extraforaminal right L3 nerve root encroachment.
4. Mild multifactorial spinal stenosis with mild narrowing of the
lateral recesses and foramina at L4-5.
